# Patient Record
Sex: Female | Born: 1965 | Race: White | Hispanic: Yes | Smoking: Current every day smoker
Health system: Southern US, Community
[De-identification: ages and names within clinical notes are randomized; demographics above are authoritative.]

## PROBLEM LIST (undated history)

## (undated) DIAGNOSIS — F32A Depression, unspecified: Secondary | ICD-10-CM

## (undated) DIAGNOSIS — F329 Major depressive disorder, single episode, unspecified: Secondary | ICD-10-CM

## (undated) DIAGNOSIS — K219 Gastro-esophageal reflux disease without esophagitis: Secondary | ICD-10-CM

## (undated) DIAGNOSIS — J45909 Unspecified asthma, uncomplicated: Secondary | ICD-10-CM

## (undated) HISTORY — PX: CLEFT PALATE REPAIR: SUR1165

## (undated) HISTORY — PX: CLEFT LIP REPAIR: SUR1164

## (undated) HISTORY — PX: ABDOMINAL HYSTERECTOMY: SHX81

---

## 2017-08-18 ENCOUNTER — Encounter (HOSPITAL_BASED_OUTPATIENT_CLINIC_OR_DEPARTMENT_OTHER): Payer: Self-pay | Admitting: Emergency Medicine

## 2017-08-18 ENCOUNTER — Emergency Department (HOSPITAL_BASED_OUTPATIENT_CLINIC_OR_DEPARTMENT_OTHER): Payer: BLUE CROSS/BLUE SHIELD

## 2017-08-18 ENCOUNTER — Emergency Department (HOSPITAL_BASED_OUTPATIENT_CLINIC_OR_DEPARTMENT_OTHER)
Admission: EM | Admit: 2017-08-18 | Discharge: 2017-08-18 | Disposition: A | Discharge status: Home / Self Care | Payer: BLUE CROSS/BLUE SHIELD | Attending: Emergency Medicine | Admitting: Emergency Medicine

## 2017-08-18 ENCOUNTER — Other Ambulatory Visit: Payer: Self-pay

## 2017-08-18 DIAGNOSIS — W19XXXA Unspecified fall, initial encounter: Secondary | ICD-10-CM

## 2017-08-18 DIAGNOSIS — M25561 Pain in right knee: Secondary | ICD-10-CM | POA: Diagnosis not present

## 2017-08-18 DIAGNOSIS — J45909 Unspecified asthma, uncomplicated: Secondary | ICD-10-CM | POA: Insufficient documentation

## 2017-08-18 DIAGNOSIS — F17210 Nicotine dependence, cigarettes, uncomplicated: Secondary | ICD-10-CM | POA: Insufficient documentation

## 2017-08-18 DIAGNOSIS — M25532 Pain in left wrist: Secondary | ICD-10-CM | POA: Insufficient documentation

## 2017-08-18 HISTORY — DX: Depression, unspecified: F32.A

## 2017-08-18 HISTORY — DX: Gastro-esophageal reflux disease without esophagitis: K21.9

## 2017-08-18 HISTORY — DX: Unspecified asthma, uncomplicated: J45.909

## 2017-08-18 HISTORY — DX: Major depressive disorder, single episode, unspecified: F32.9

## 2017-08-18 MED ORDER — DICLOFENAC SODIUM 1 % TD GEL: 2.0000 g | Freq: Four times a day (QID) | TRANSDERMAL | 0 refills | Status: AC

## 2017-08-18 NOTE — Discharge Instructions (Addendum)
As discussed, your imaging was negative for acute fracture or dislocation.  Follow the rice protocol outlined in these instructions which include rest, ice, compress (knee sleeve), elevate.  You may need to use a cane for your travels this weekend to help reduce the weightbearing on that side.  Follow up with orthopedics.  Take tylenol as needed for pain.  every 6 hours. Do not exceed 4000 mg of acetaminophen-containing products in a 24 hour period. Follow up with your primary care provider as needed. Return to the emergency department if you experience swelling, redness, warmth, fever, chills, numbness or weakness in the meantime.

## 2017-08-18 NOTE — ED Provider Notes (Signed)
[facility] EMERGENCY DEPARTMENT Provider Note   CSN: [id number] Arrival date & time: 08/18/17  1621     History   Chief Complaint Chief Complaint  Patient presents with  . Fall    HPI Sandy Ortega is a 52 y.o. female past medical history of asthma, depression, GERD presenting with left wrist and right knee pain persisting after a fall 3 days ago.  Patient reports that her pain is aggravated in the right knee any time she puts weight on it and feels like it is going to give out.  Does report some nausea with the pain but no vomiting, numbness, weakness, fever or chills.  Denies any pain with range of motion while seated.  She reports edema and warmth. She has a history of meniscal tears bilaterally that have been repaired.  States that it feels as though it is catching when she puts weight on her right knee.  HPI  Past Medical History:  Diagnosis Date  . Asthma   . Depression   . GERD (gastroesophageal reflux disease)     There are no active problems to display for this patient.   Past Surgical History:  Procedure Laterality Date  . ABDOMINAL HYSTERECTOMY    . CLEFT LIP REPAIR    . CLEFT PALATE REPAIR       OB History   None      Home Medications    Prior to Admission medications   Not on File    Family History History reviewed. No pertinent family history.  Social History Social History   Tobacco Use  . Smoking status: Current Every Day Smoker  . Smokeless tobacco: Never Used  Substance Use Topics  . Alcohol use: Never    Frequency: Never  . Drug use: Never     Allergies   Patient has no known allergies.   Review of Systems Review of Systems  Constitutional: Negative for chills, diaphoresis, fatigue and fever.  Gastrointestinal: Positive for nausea. Negative for vomiting.  Musculoskeletal: Positive for arthralgias and joint swelling.  Neurological: Negative for dizziness, weakness, light-headedness and numbness.      Physical Exam Updated Vital Signs BP (!) 131/91   Pulse 78   Temp 98.4 F (36.9 C) (Oral)   Resp 20   Ht 5' (1.524 m)   Wt 89.4 kg (197 lb)   SpO2 98%   BMI 38.47 kg/m   Physical Exam  Constitutional: She appears well-developed and well-nourished. No distress.  Well-appearing, nontoxic afebrile sitting comfortably in chair in no acute distress.  HENT:  Head: Normocephalic.  Eyes: Right eye exhibits no discharge. Left eye exhibits no discharge.  Cardiovascular: Normal rate.  Pulmonary/Chest: Effort normal. No respiratory distress.  Musculoskeletal: Normal range of motion. She exhibits tenderness. She exhibits no edema or deformity.  Full range of motion of the right knee, slight warmth to the touch, no pain with range of motion, positive McMurray sign with internal rotation of the right knee. Small superficial abrasion over the right knee with erytema, no edema. Crepitus and ttp of patella.  Neurological: She is alert. No sensory deficit. She exhibits normal muscle tone.  5/5 strength in lower extremities bilaterally, strong dorsalis pedis pulses, sensation intact, neurovascularly intact.  Skin: Skin is warm and dry. She is not diaphoretic. There is erythema.  Superficial abrasion and focal 2cm erythema over the right patella  Psychiatric: She has a normal mood and affect. Her behavior is normal.  Nursing note and vitals reviewed.  ED Treatments / Results  Labs (all labs ordered are listed, but only abnormal results are displayed) Labs Reviewed - No data to display  EKG None  Radiology Dg Wrist Complete Left  Result Date: 08/18/2017 CLINICAL DATA:  Fall 3 days ago with left wrist pain. EXAM: LEFT WRIST - COMPLETE 3+ VIEW COMPARISON:  None. FINDINGS: There is no evidence of fracture or dislocation. There is no evidence of arthropathy. Posttraumatic deformity of the left first metatarsal bone with chronic appearance. Soft tissues are unremarkable. IMPRESSION: No acute  fracture or dislocation identified about the left wrist. Electronically Signed   By: Ted Mcalpine M.D.   On: 08/18/2017 17:10   Dg Knee Complete 4 Views Right  Result Date: 08/18/2017 CLINICAL DATA:  Larey Seat several days ago landing on the knees EXAM: RIGHT KNEE - COMPLETE 4+ VIEW COMPARISON:  None. FINDINGS: No significant degenerative change is seen involving the compartments of the knee. No fracture is noted, and there is no evidence of knee joint effusion. The patella appears intact. IMPRESSION: Negative. Electronically Signed   By: Dwyane Dee M.D.   On: 08/18/2017 17:09    Procedures Procedures (including critical care time)  Medications Ordered in ED Medications - No data to display   Initial Impression / Assessment and Plan / ED Course  I have reviewed the triage vital signs and the nursing notes.  Pertinent labs & imaging results that were available during my care of the patient were reviewed by me and considered in my medical decision making (see chart for details).     Presenting with persistent right knee pain and left wrist pain after fall that occurred 2 days ago. Positive McMurray sign when rotated medially.  Imaging negative for effusion, fracture or dislocation with unremarkable soft tissue.  5/5 strength in lower extremities bilaterally.   warmth to the touch, superficial abrasion and mild focal erythema, no edema, no pain with rom.  Low suspicion for septic arthritis.  She is otherwise well-appearing nontoxic afebrile.  Knee sleeve was provided and patient declined crutches. she will be buying a cane to help her ambulate during her travels this weekend. Rice protocol indicated and discussed with patient.  History and exam findings concerning for possible medial meniscal tear.  Will have patient follow-up with orthopedics. Discharge home with symptomatic relief and close follow-up.  Discussed strict return precautions and advised to return to the emergency  department if experiencing any new or worsening symptoms. Instructions were understood and patient agreed with discharge plan.  Final Clinical Impressions(s) / ED Diagnoses   Final diagnoses:  Acute pain of right knee    ED Discharge Orders    None       Gregary Cromer 08/18/17 1904    Pricilla Loveless, MD 08/18/17 [id number]

## 2017-08-18 NOTE — ED Triage Notes (Signed)
Patient states that she fell on Saturday  - the patient reports that she continues to have pain to her right knee and left wrist.

## 2019-11-20 IMAGING — CR DG KNEE COMPLETE 4+V*R*
4 series · 4 of 4 positions shown · non-contrast
Comparison: None.

CLINICAL DATA: Fell several days ago landing on the knees

EXAM:
RIGHT KNEE - COMPLETE 4+ VIEW

[t knee ap left]
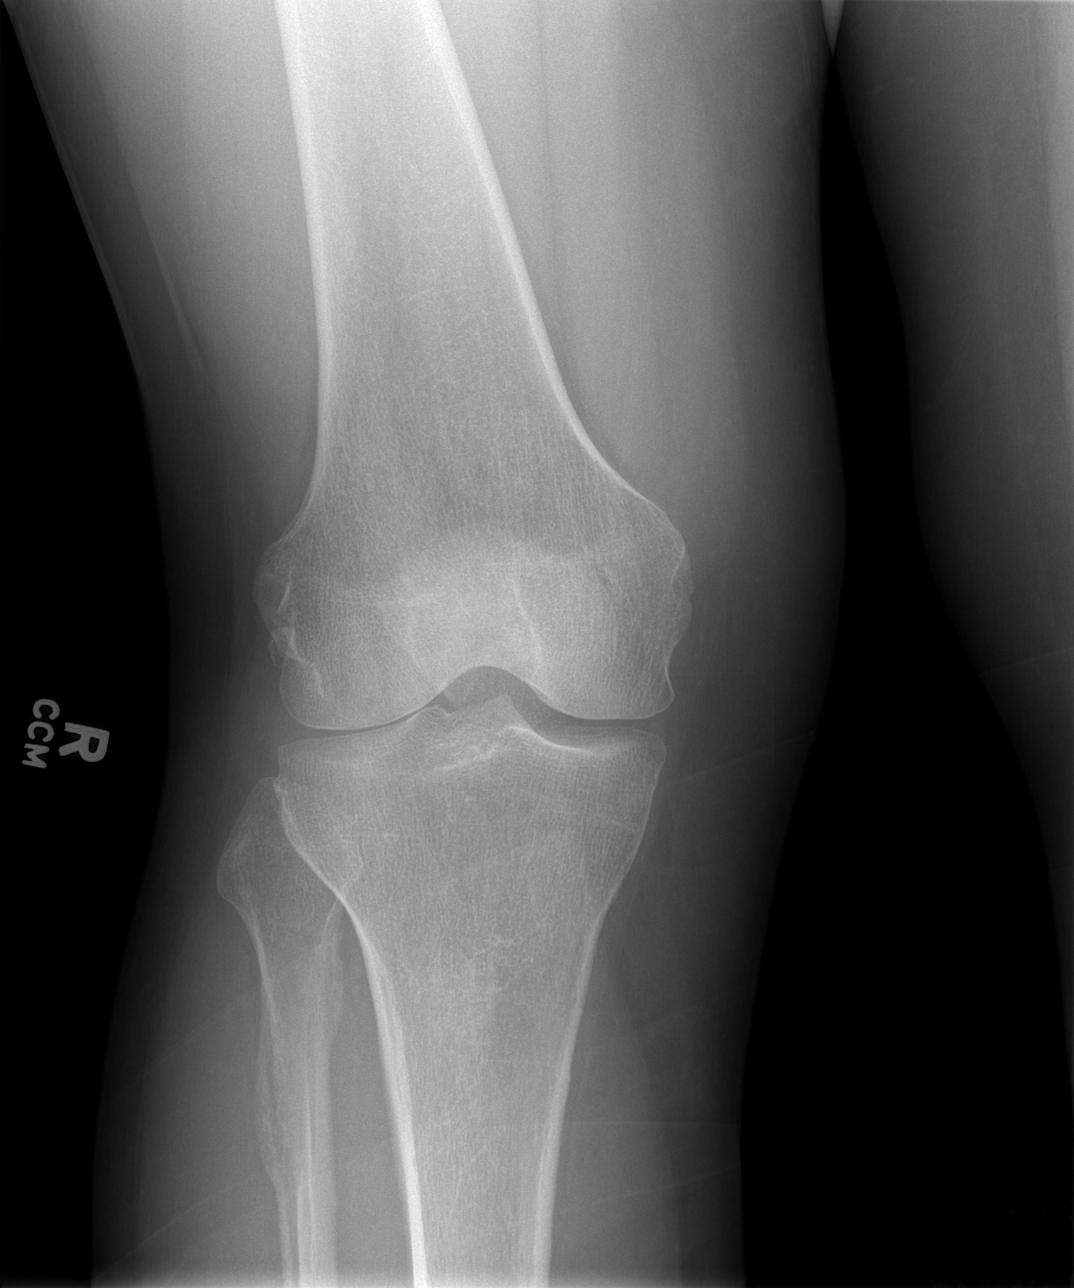

[t knee oblique left (1 of 2)]
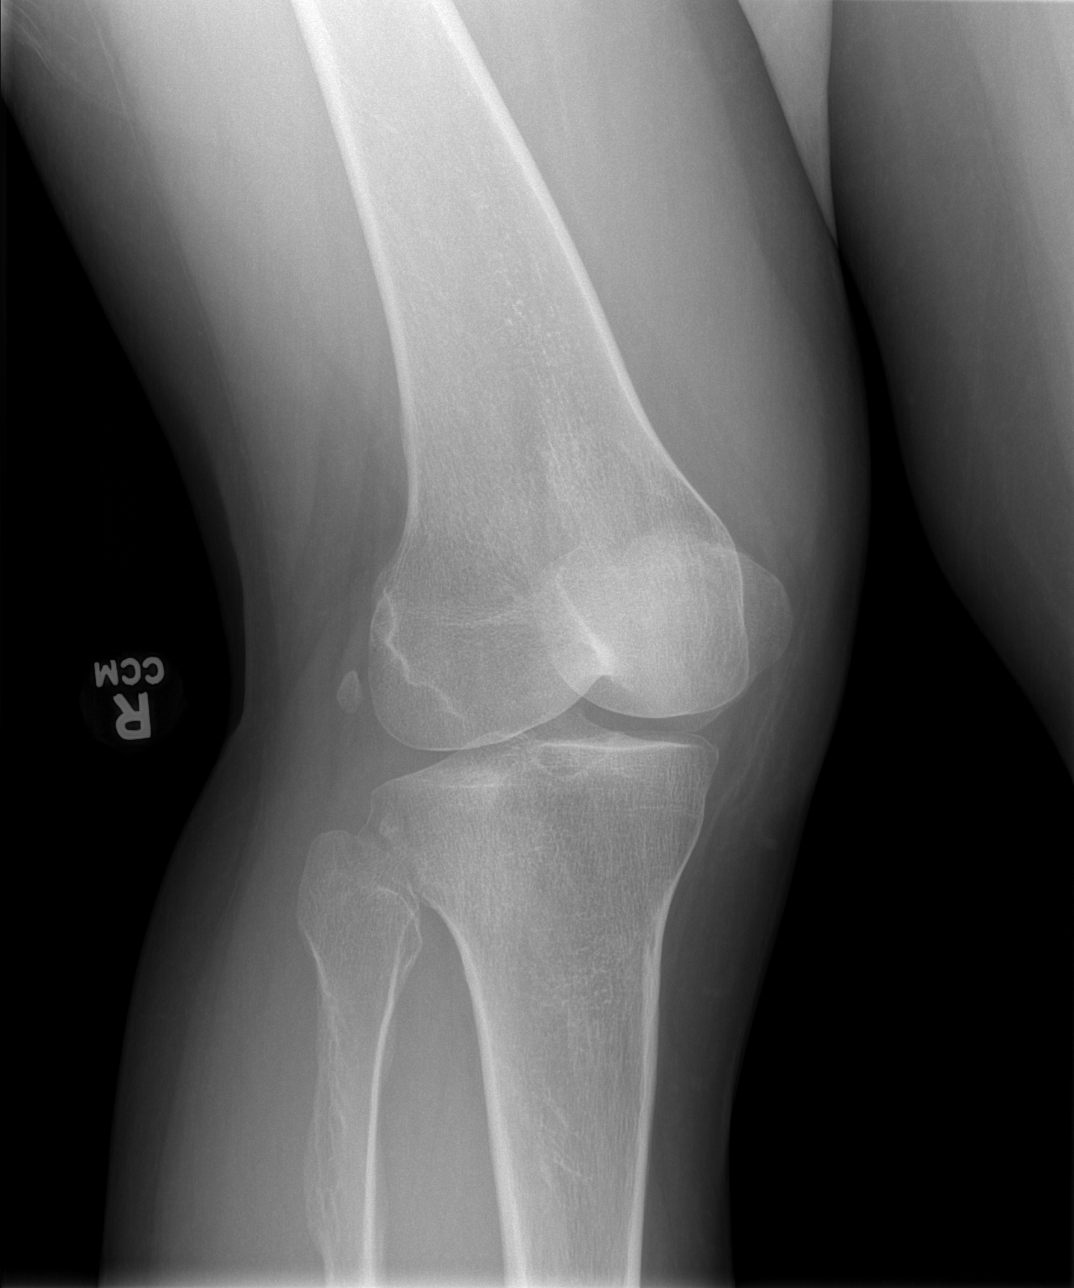

[t knee oblique left (2 of 2)]
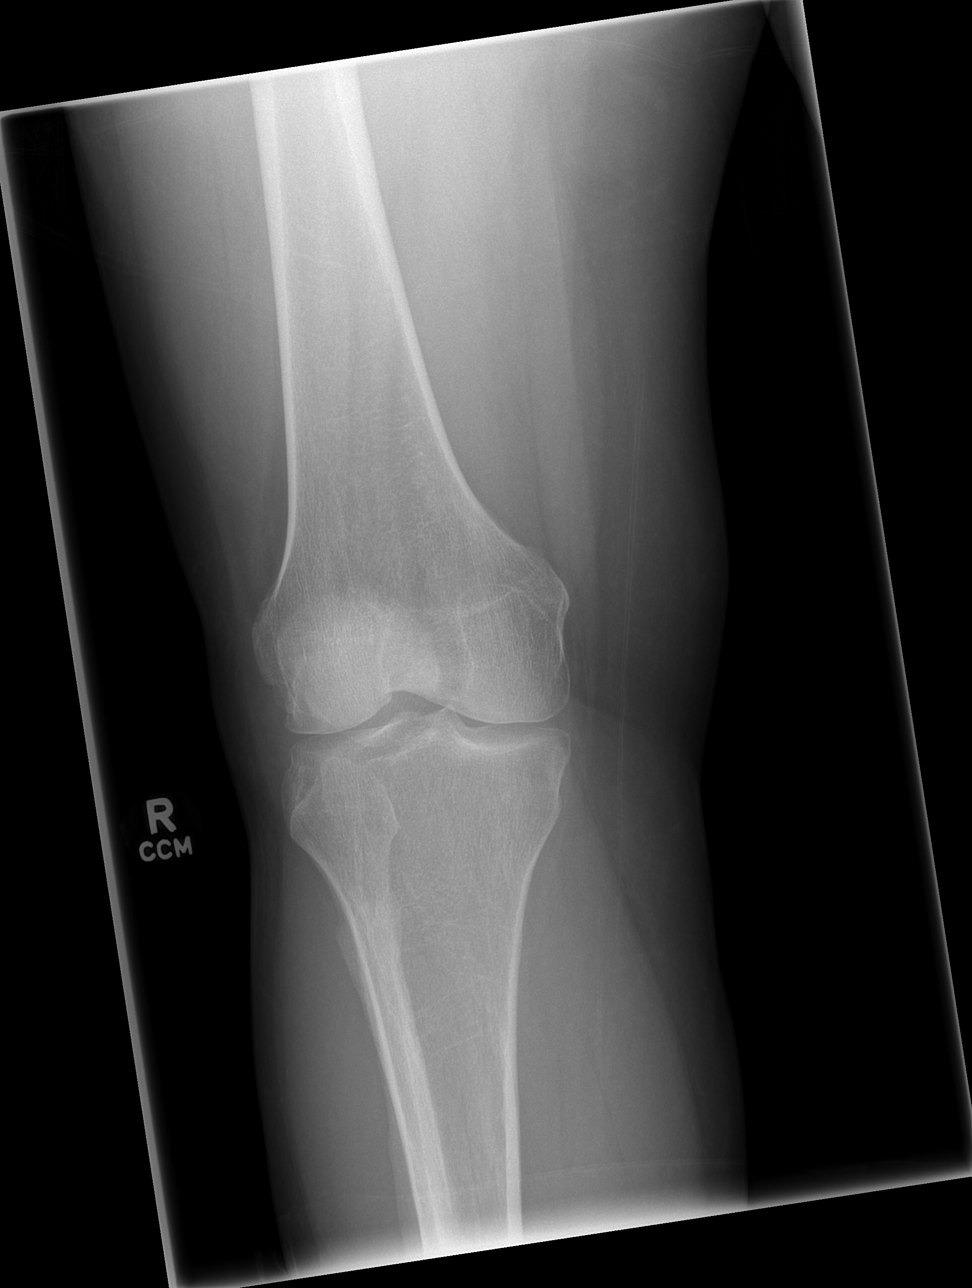

[t knee lat left]
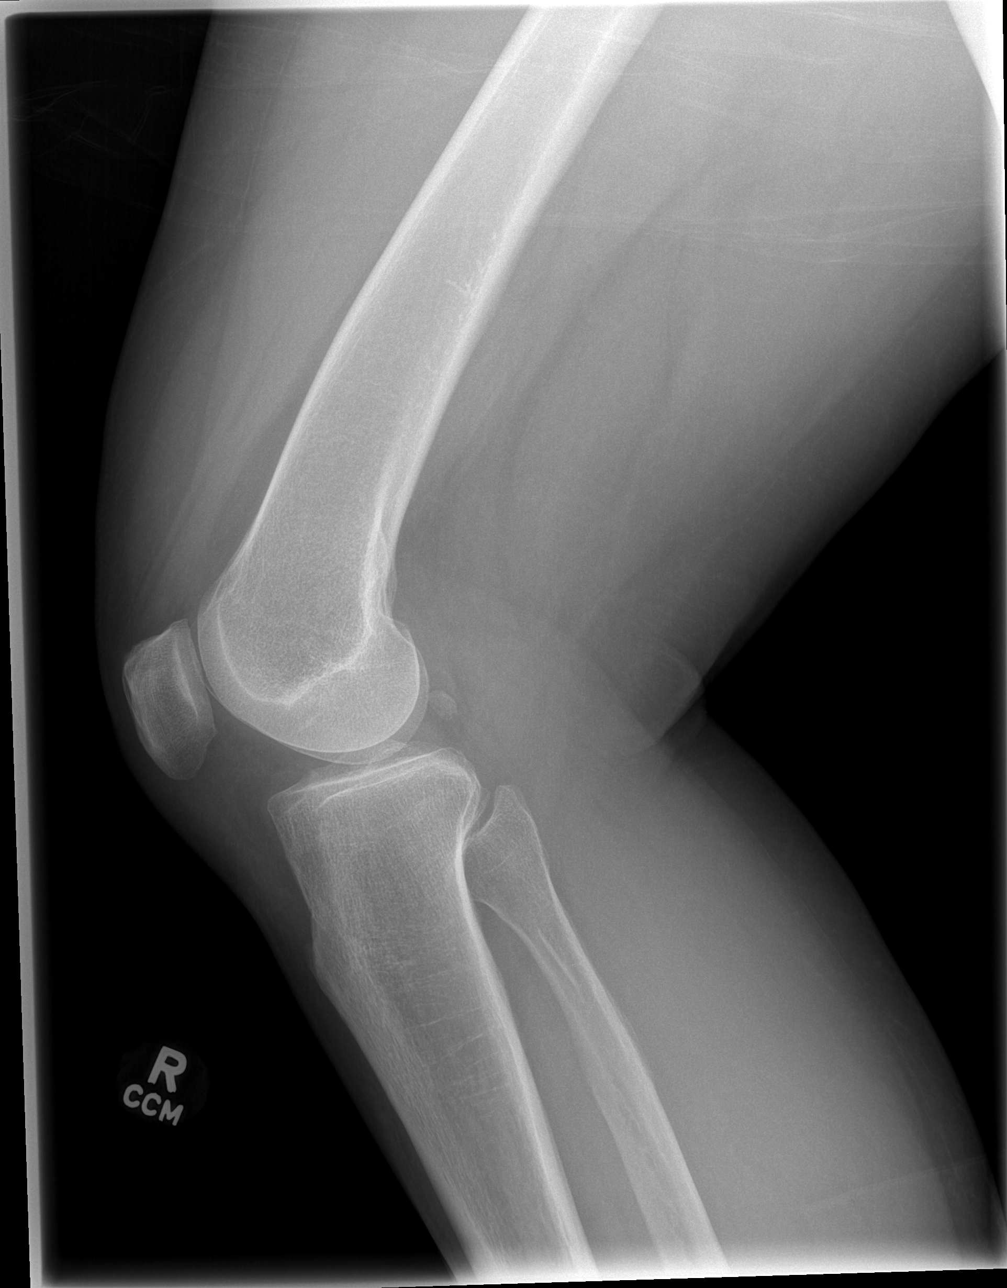

[4 of 4 positions shown; findings below may reference images not displayed]

FINDINGS: No significant degenerative change is seen involving the
compartments of the knee. No fracture is noted, and there is no
evidence of knee joint effusion. The patella appears intact.
IMPRESSION: Negative.
